# Patient Record
Sex: Female | Born: 1980
Health system: Southern US, Community
[De-identification: ages and names within clinical notes are randomized; demographics above are authoritative.]

## PROBLEM LIST (undated history)

## (undated) DIAGNOSIS — T7840XA Allergy, unspecified, initial encounter: Secondary | ICD-10-CM

## (undated) DIAGNOSIS — J45909 Unspecified asthma, uncomplicated: Secondary | ICD-10-CM

## (undated) HISTORY — DX: Unspecified asthma, uncomplicated: J45.909

## (undated) HISTORY — DX: Allergy, unspecified, initial encounter: T78.40XA

---

## 2010-10-28 ENCOUNTER — Inpatient Hospital Stay (INDEPENDENT_AMBULATORY_CARE_PROVIDER_SITE_OTHER)
Admission: RE | Admit: 2010-10-28 | Discharge: 2010-10-28 | Disposition: A | Discharge status: Home / Self Care | Payer: BC Managed Care – PPO | Source: Ambulatory Visit | Attending: Family Medicine | Admitting: Family Medicine

## 2010-10-28 DIAGNOSIS — L989 Disorder of the skin and subcutaneous tissue, unspecified: Secondary | ICD-10-CM

## 2011-03-18 ENCOUNTER — Telehealth: Payer: Self-pay

## 2011-03-18 NOTE — Telephone Encounter (Signed)
Pt has made an appt to see chell on 04/25/11, but will be out of birth control before then. Pt wants to know if we can call in 1 month. Pt can be reached on her cell [phone] and her pharmacy is [facility] on [email or web address]

## 2011-03-19 NOTE — Telephone Encounter (Signed)
Called pharmacy to OK #[phone] plus 1 RF if needed for pt's trisprintec to cover her until appt 4/11. LMOM for pt that was done

## 2011-03-20 ENCOUNTER — Other Ambulatory Visit: Payer: Self-pay | Admitting: Physician Assistant

## 2011-04-25 ENCOUNTER — Encounter: Payer: Self-pay | Admitting: Physician Assistant

## 2011-05-16 ENCOUNTER — Encounter: Payer: Self-pay | Admitting: Physician Assistant

## 2011-05-16 ENCOUNTER — Ambulatory Visit (INDEPENDENT_AMBULATORY_CARE_PROVIDER_SITE_OTHER): Payer: BC Managed Care – PPO | Admitting: Physician Assistant

## 2011-05-16 VITALS — BP 120/79 | HR 62 | Temp 98.5°F | Resp 16 | Ht 67.5 in | Wt 174.2 lb

## 2011-05-16 DIAGNOSIS — B3731 Acute candidiasis of vulva and vagina: Secondary | ICD-10-CM

## 2011-05-16 DIAGNOSIS — Z0000 Encounter for general adult medical examination without abnormal findings: Secondary | ICD-10-CM

## 2011-05-16 DIAGNOSIS — Z113 Encounter for screening for infections with a predominantly sexual mode of transmission: Secondary | ICD-10-CM

## 2011-05-16 DIAGNOSIS — B373 Candidiasis of vulva and vagina: Secondary | ICD-10-CM

## 2011-05-16 DIAGNOSIS — Z309 Encounter for contraceptive management, unspecified: Secondary | ICD-10-CM

## 2011-05-16 DIAGNOSIS — H6690 Otitis media, unspecified, unspecified ear: Secondary | ICD-10-CM

## 2011-05-16 LAB — POCT UA - MICROSCOPIC ONLY
Bacteria, U Microscopic: NEGATIVE
Casts, Ur, LPF, POC: NEGATIVE
Crystals, Ur, HPF, POC: NEGATIVE
Mucus, UA: NEGATIVE

## 2011-05-16 LAB — POCT URINALYSIS DIPSTICK
Glucose, UA: NEGATIVE
Ketones, UA: NEGATIVE
Spec Grav, UA: 1.02
Urobilinogen, UA: 0.2

## 2011-05-16 LAB — POCT WET PREP WITH KOH
Clue Cells Wet Prep HPF POC: NEGATIVE
Yeast Wet Prep HPF POC: NEGATIVE

## 2011-05-16 MED ORDER — FLUCONAZOLE 150 MG PO TABS: 150.0000 mg | ORAL_TABLET | Freq: Once | ORAL | Status: AC

## 2011-05-16 MED ORDER — IPRATROPIUM BROMIDE 0.06 % NA SOLN: 2.0000 | Freq: Two times a day (BID) | NASAL | Status: DC

## 2011-05-16 MED ORDER — AMOXICILLIN 875 MG PO TABS: 875.0000 mg | ORAL_TABLET | Freq: Two times a day (BID) | ORAL | Status: AC

## 2011-05-16 MED ORDER — NORGESTIM-ETH ESTRAD TRIPHASIC 0.18/0.215/0.25 MG-35 MCG PO TABS: 1.0000 | ORAL_TABLET | Freq: Every day | ORAL | Status: DC

## 2011-05-16 NOTE — Patient Instructions (Signed)

## 2011-05-16 NOTE — Progress Notes (Signed)
Subjective:    Patient ID: Sheryl Manning, female    DOB: 1980/02/27, 31 y.o.   MRN: [id number]  HPI  Patient here for annual exam. C/o right ear pain and clogging, after flight . Inner ears itchy. Denies fever, nausea, vomiting, headaches, chills or myalgias.  Patient is not fasting.  Needle phobia.  She had low vit D levels on previous bloodwork,completed a course of treatment,  but has not been rechecked.  She is sexually active and on OCPs.  She is interested in STI screening but would prefer to do bloodwork at a later time.    Review of Systems  Constitutional: Negative.   Respiratory: Negative.   Cardiovascular: Negative.   Gastrointestinal: Negative.   Genitourinary: Negative.        Lmp 4/6  Musculoskeletal: Negative.   Neurological: Negative.   Hematological: Negative.   Psychiatric/Behavioral: Negative.        Objective:   Physical Exam  Constitutional: She is oriented to person, place, and time. She appears well-developed and well-nourished.  HENT:  Head: Normocephalic and atraumatic.  Right Ear: Hearing and external ear normal. No lacerations. No drainage. No foreign bodies. No mastoid tenderness. A middle ear effusion is present. There is hemotympanum.  Left Ear: Hearing and external ear normal.  Nose: Nose normal.  Mouth/Throat: Oropharynx is clear and moist. No oropharyngeal exudate.       There is effusion, blood is present from 10-12 clock hours, there is a cone of purulence between 5-7 clock hours.    Neck: Normal range of motion. Neck supple. No tracheal deviation present. No thyromegaly present.  Cardiovascular: Normal rate, regular rhythm, normal heart sounds and intact distal pulses.   Pulmonary/Chest: Effort normal and breath sounds normal. No stridor. No respiratory distress.  Abdominal: Soft. Bowel sounds are normal. She exhibits no distension and no mass. There is no tenderness. There is no guarding.  Genitourinary: Uterus normal. No breast  swelling, tenderness, discharge or bleeding. Pelvic exam was performed with patient supine. There is no rash, tenderness, lesion or injury on the right labia. There is no rash, tenderness, lesion or injury on the left labia. No erythema around the vagina. Vaginal discharge (thickened white discharge on vaginal exam) found.       Nulliparous cervix with ectropion.  No discharge noted.  Musculoskeletal: Normal range of motion. She exhibits no edema and no tenderness.  Lymphadenopathy:    She has no cervical adenopathy.  Neurological: She is alert and oriented to person, place, and time. She has normal reflexes. No cranial nerve deficit. She exhibits normal muscle tone. Coordination normal.  Skin: Skin is warm and dry. No rash noted. No erythema. No pallor.   Filed Vitals:   05/16/11 1357  BP: 120/79  Pulse: 62  Temp: 98.5 F (36.9 C)  Resp: 16   Results for orders placed in visit on 05/16/11  POCT UA - MICROSCOPIC ONLY      Component Value Range   WBC, Ur, HPF, POC 0-1     RBC, urine, microscopic 0-3     Bacteria, U Microscopic neg     Mucus, UA neg     Epithelial cells, urine per micros 0-6     Crystals, Ur, HPF, POC neg     Casts, Ur, LPF, POC neg     Yeast, UA neg    POCT URINALYSIS DIPSTICK      Component Value Range   Color, UA yellow     Clarity, UA clear  Glucose, UA neg     Bilirubin, UA neg     Ketones, UA neg     Spec Grav, UA 1.020     Blood, UA trace     pH, UA 5.5     Protein, UA neg     Urobilinogen, UA 0.2     Nitrite, UA neg     Leukocytes, UA Negative    POCT WET PREP WITH KOH      Component Value Range   Trichomonas, UA Negative     Clue Cells Wet Prep HPF POC neg     Epithelial Wet Prep HPF POC 3-tntc     Yeast Wet Prep HPF POC neg     Bacteria Wet Prep HPF POC 2+     RBC Wet Prep HPF POC 0-3     WBC Wet Prep HPF POC 0-5     KOH Prep POC Positive    GC/CHLAMYDIA PROBE AMP, GENITAL      Component Value Range   Chlamydia, DNA Probe NEGATIVE      GC Probe Amp, Genital NEGATIVE            Assessment & Plan:   1. AOM (acute otitis media)  ipratropium (ATROVENT) 0.06 % nasal spray, amoxicillin (AMOXIL) 875 MG tablet  2. Routine general medical examination at a health care facility  POCT UA - Microscopic Only, POCT urinalysis dipstick  3. Contraception management  Norgestimate-Ethinyl Estradiol Triphasic (TRI-SPRINTEC) 0.18/0.215/0.25 MG-35 MCG tablet  4. Screening examination for venereal disease  POCT Wet Prep with KOH, GC/chlamydia probe amp, genital  5. Vaginal yeast infection  fluconazole (DIFLUCAN) 150 MG tablet  supportive care see patient instructions. Patient instructed to have work fax over most recent lab values. RTC one year and PRN.

## 2011-05-17 LAB — GC/CHLAMYDIA PROBE AMP, GENITAL: Chlamydia, DNA Probe: NEGATIVE

## 2011-05-17 NOTE — Progress Notes (Signed)
I have examined this patient along with the student and agree.  

## 2012-04-02 ENCOUNTER — Other Ambulatory Visit: Payer: Self-pay | Admitting: Physician Assistant

## 2013-05-05 ENCOUNTER — Encounter (HOSPITAL_COMMUNITY): Payer: Self-pay | Admitting: Emergency Medicine

## 2013-05-05 ENCOUNTER — Emergency Department (HOSPITAL_COMMUNITY)
Admission: EM | Admit: 2013-05-05 | Discharge: 2013-05-05 | Disposition: A | Discharge status: Home / Self Care | Payer: BC Managed Care – PPO | Source: Home / Self Care | Attending: Family Medicine | Admitting: Family Medicine

## 2013-05-05 DIAGNOSIS — S39012A Strain of muscle, fascia and tendon of lower back, initial encounter: Secondary | ICD-10-CM

## 2013-05-05 DIAGNOSIS — X58XXXA Exposure to other specified factors, initial encounter: Secondary | ICD-10-CM

## 2013-05-05 DIAGNOSIS — S335XXA Sprain of ligaments of lumbar spine, initial encounter: Secondary | ICD-10-CM

## 2013-05-05 MED ORDER — TRAMADOL HCL 50 MG PO TABS: 50.0000 mg | ORAL_TABLET | Freq: Four times a day (QID) | ORAL | Status: DC | PRN

## 2013-05-05 MED ORDER — DICLOFENAC SODIUM 50 MG PO TBEC: 50.0000 mg | DELAYED_RELEASE_TABLET | Freq: Two times a day (BID) | ORAL | Status: DC | PRN

## 2013-05-05 MED ORDER — CYCLOBENZAPRINE HCL 5 MG PO TABS: 5.0000 mg | ORAL_TABLET | Freq: Every evening | ORAL | Status: DC | PRN

## 2013-05-05 NOTE — ED Provider Notes (Signed)
Sheryl RogersRebecca Recendiz is a 33 y.o. female who presents to Urgent Care today for low back pain. Patient developed low back pain starting today. She denies any injury. She tended you have a class last night and may have "overdone it".  She denies any radiating pain weakness or numbness. The pain is moderate and worse with activity and better with rest. Patient has used Tylenol which helps some. Symptoms consistent with prior episodes low-back strain.   No past medical history on file. History  Substance Use Topics  . Smoking status: Never Smoker   . Smokeless tobacco: Not on file  . Alcohol Use: Yes   ROS as above Medications: No current facility-administered medications for this encounter.   Current Outpatient Prescriptions  Medication Sig Dispense Refill  . albuterol (PROVENTIL HFA;VENTOLIN HFA) 108 (90 BASE) MCG/ACT inhaler Inhale 2 puffs into the lungs every 6 (six) hours as needed.      . Norgestimate-Ethinyl Estradiol Triphasic (TRI-PREVIFEM) 0.18/0.215/0.25 MG-35 MCG tablet Take 1 tablet by mouth daily. Needs office visit  28 tablet  1  . cyclobenzaprine (FLEXERIL) 5 MG tablet Take 1 tablet (5 mg total) by mouth at bedtime as needed for muscle spasms.  20 tablet  0  . diclofenac (VOLTAREN) 50 MG EC tablet Take 1 tablet (50 mg total) by mouth 2 (two) times daily as needed.  60 tablet  0  . traMADol (ULTRAM) 50 MG tablet Take 1 tablet (50 mg total) by mouth every 6 (six) hours as needed.  15 tablet  0  . [DISCONTINUED] fexofenadine (ALLEGRA) 180 MG tablet Take 180 mg by mouth daily.      . [DISCONTINUED] ipratropium (ATROVENT) 0.06 % nasal spray Place 2 sprays into the nose 2 (two) times daily.  15 mL  1    Exam:  BP 114/67  Pulse 70  Temp(Src) 98.2 F (36.8 C) (Oral)  Resp 16  SpO2 99%  LMP 04/15/2013 Gen: Well NAD HEENT: EOMI,  MMM Lungs: Normal work of breathing. CTABL Heart: RRR no MRG Abd: NABS, Soft. NT, ND Exts: Brisk capillary refill, warm and well perfused.  Back:  Nontender to spinal midline. Tender palpation bilateral lumbar paraspinals. Decreased range of motion secondary to pain. Negative Faber straight leg raise test bilaterally. Strength is intact throughout. Reflexes are equal and normal bilaterally extremities. Normal gait  No results found for this or any previous visit (from the past 24 hour(s)). No results found.  Assessment and Plan: 33 y.o. female with lumbago due to myofascial stretching. Plan to treat with diclofenac Flexeril and tramadol. Heating pad, home exercise program and physical therapy.  Discussed warning signs or symptoms. Please see discharge instructions. Patient expresses understanding.    Rodolph BongEvan S Madisson Kulaga, MD 05/05/13 2036

## 2013-05-05 NOTE — ED Notes (Signed)
Pt c/o lower back pain onset this am Reports she was at yoga class last night prior to this she was fine Pain increases w/activity; had tyle today at 1200 Denies abd pain, urinary sx Alert w/no signs of acute distress.

## 2013-05-05 NOTE — Discharge Instructions (Signed)
Thank you for coming in today. Take medications as directed.  Do not drive after taking tramadol Come back or go to the emergency room if you notice new weakness new numbness problems walking or bowel or bladder problems.  Back Exercises These exercises may help you when beginning to rehabilitate your injury. Your symptoms may resolve with or without further involvement from your physician, physical therapist or athletic trainer. While completing these exercises, remember:   Restoring tissue flexibility helps normal motion to return to the joints. This allows healthier, less painful movement and activity.  An effective stretch should be held for at least 30 seconds.  A stretch should never be painful. You should only feel a gentle lengthening or release in the stretched tissue. STRETCH  Extension, Prone on Elbows   Lie on your stomach on the floor, a bed will be too soft. Place your palms about shoulder width apart and at the height of your head.  Place your elbows under your shoulders. If this is too painful, stack pillows under your chest.  Allow your body to relax so that your hips drop lower and make contact more completely with the floor.  Hold this position for __________ seconds.  Slowly return to lying flat on the floor. Repeat __________ times. Complete this exercise __________ times per day.  RANGE OF MOTION  Extension, Prone Press Ups   Lie on your stomach on the floor, a bed will be too soft. Place your palms about shoulder width apart and at the height of your head.  Keeping your back as relaxed as possible, slowly straighten your elbows while keeping your hips on the floor. You may adjust the placement of your hands to maximize your comfort. As you gain motion, your hands will come more underneath your shoulders.  Hold this position __________ seconds.  Slowly return to lying flat on the floor. Repeat __________ times. Complete this exercise __________ times per day.    RANGE OF MOTION- Quadruped, Neutral Spine   Assume a hands and knees position on a firm surface. Keep your hands under your shoulders and your knees under your hips. You may place padding under your knees for comfort.  Drop your head and point your tail bone toward the ground below you. This will round out your low back like an angry cat. Hold this position for __________ seconds.  Slowly lift your head and release your tail bone so that your back sags into a large arch, like an old horse.  Hold this position for __________ seconds.  Repeat this until you feel limber in your low back.  Now, find your "sweet spot." This will be the most comfortable position somewhere between the two previous positions. This is your neutral spine. Once you have found this position, tense your stomach muscles to support your low back.  Hold this position for __________ seconds. Repeat __________ times. Complete this exercise __________ times per day.  STRETCH  Flexion, Single Knee to Chest   Lie on a firm bed or floor with both legs extended in front of you.  Keeping one leg in contact with the floor, bring your opposite knee to your chest. Hold your leg in place by either grabbing behind your thigh or at your knee.  Pull until you feel a gentle stretch in your low back. Hold __________ seconds.  Slowly release your grasp and repeat the exercise with the opposite side. Repeat __________ times. Complete this exercise __________ times per day.  STRETCH - Hamstrings,  Standing  Stand or sit and extend your right / left leg, placing your foot on a chair or foot stool  Keeping a slight arch in your low back and your hips straight forward.  Lead with your chest and lean forward at the waist until you feel a gentle stretch in the back of your right / left knee or thigh. (When done correctly, this exercise requires leaning only a small distance.)  Hold this position for __________ seconds. Repeat __________  times. Complete this stretch __________ times per day. STRENGTHENING  Deep Abdominals, Pelvic Tilt   Lie on a firm bed or floor. Keeping your legs in front of you, bend your knees so they are both pointed toward the ceiling and your feet are flat on the floor.  Tense your lower abdominal muscles to press your low back into the floor. This motion will rotate your pelvis so that your tail bone is scooping upwards rather than pointing at your feet or into the floor.  With a gentle tension and even breathing, hold this position for __________ seconds. Repeat __________ times. Complete this exercise __________ times per day.  STRENGTHENING  Abdominals, Crunches   Lie on a firm bed or floor. Keeping your legs in front of you, bend your knees so they are both pointed toward the ceiling and your feet are flat on the floor. Cross your arms over your chest.  Slightly tip your chin down without bending your neck.  Tense your abdominals and slowly lift your trunk high enough to just clear your shoulder blades. Lifting higher can put excessive stress on the low back and does not further strengthen your abdominal muscles.  Control your return to the starting position. Repeat __________ times. Complete this exercise __________ times per day.  STRENGTHENING  Quadruped, Opposite UE/LE Lift   Assume a hands and knees position on a firm surface. Keep your hands under your shoulders and your knees under your hips. You may place padding under your knees for comfort.  Find your neutral spine and gently tense your abdominal muscles so that you can maintain this position. Your shoulders and hips should form a rectangle that is parallel with the floor and is not twisted.  Keeping your trunk steady, lift your right hand no higher than your shoulder and then your left leg no higher than your hip. Make sure you are not holding your breath. Hold this position __________ seconds.  Continuing to keep your abdominal  muscles tense and your back steady, slowly return to your starting position. Repeat with the opposite arm and leg. Repeat __________ times. Complete this exercise __________ times per day. Document Released: 01/18/2005 Document Revised: 03/25/2011 Document Reviewed: 04/14/2008 [facility] Patient Information 2014 [employer], [place].

## 2014-03-15 ENCOUNTER — Ambulatory Visit (INDEPENDENT_AMBULATORY_CARE_PROVIDER_SITE_OTHER): Payer: BLUE CROSS/BLUE SHIELD | Admitting: Family Medicine

## 2014-03-15 VITALS — BP 112/76 | HR 89 | Temp 100.0°F | Resp 17 | Ht 68.0 in | Wt 196.0 lb

## 2014-03-15 DIAGNOSIS — J029 Acute pharyngitis, unspecified: Secondary | ICD-10-CM

## 2014-03-15 LAB — POCT RAPID STREP A (OFFICE): Rapid Strep A Screen: NEGATIVE

## 2014-03-15 MED ORDER — PENICILLIN V POTASSIUM 500 MG PO TABS: ORAL_TABLET | ORAL | Status: DC

## 2014-03-15 NOTE — Progress Notes (Signed)
Subjective 34 year old lady who has had a sore throat since yesterday morning. She works for a [employer] and [place] [employer] [place] and just got back a few minutes ago. She had the bad sore throat. She feels ill. She felt like she was febrile last night possibly but did not take her temperature. She has no head congestion. Has a minimal cough. She does have a history of asthma. Not on any regular medications except for her asthma medicines. She is a nonsmoker.  Objective: No acute distress. TMs normal. Throat erythematous and edematous-looking tissues without any exudate. Strep screen and culture taken. Neck supple with small anterior cervical nodes. Chest is clear to auscultation. Heart regular without murmurs. She was warm to touch sore return for temperature and the temperature is now 100.  Assessment: Pharyngitis History of asthma  Plan: Strep test and culture if needed  Results for orders placed or performed in visit on 03/15/14  POCT rapid strep A  Result Value Ref Range   Rapid Strep A Screen Negative Negative     Will treat symptomatically. Also will give antibiotics for the possibility of strep. If the culture is negative she is to discontinue the medication.

## 2014-03-15 NOTE — Patient Instructions (Signed)
Take penicillin one twice daily. If culture is negative please dispose of remaining medications and discontinued taking them.  Drink plenty of fluids and get enough rest  Take lozenges as needed for sore throat  Tylenol 1000 mg (acetaminophen) 3 times daily and/or ibuprofen 600 mg 3-4 times daily will also help with pain and fever.

## 2014-03-17 LAB — CULTURE, GROUP A STREP: ORGANISM ID, BACTERIA: NORMAL

## 2014-11-10 ENCOUNTER — Encounter (HOSPITAL_COMMUNITY): Payer: Self-pay | Admitting: Emergency Medicine

## 2014-11-10 ENCOUNTER — Emergency Department (HOSPITAL_COMMUNITY)
Admission: EM | Admit: 2014-11-10 | Discharge: 2014-11-10 | Disposition: A | Discharge status: Home / Self Care | Payer: BLUE CROSS/BLUE SHIELD | Source: Home / Self Care | Attending: Emergency Medicine | Admitting: Emergency Medicine

## 2014-11-10 DIAGNOSIS — S39012A Strain of muscle, fascia and tendon of lower back, initial encounter: Secondary | ICD-10-CM | POA: Diagnosis not present

## 2014-11-10 MED ORDER — IBUPROFEN 800 MG PO TABS: 800.0000 mg | ORAL_TABLET | Freq: Three times a day (TID) | ORAL | Status: AC | PRN

## 2014-11-10 MED ORDER — CYCLOBENZAPRINE HCL 5 MG PO TABS: 5.0000 mg | ORAL_TABLET | Freq: Three times a day (TID) | ORAL | Status: DC | PRN

## 2014-11-10 NOTE — Discharge Instructions (Signed)
You strained the muscles in your lower back. Take ibuprofen 800 mg 3 times a day for the next few days, then as needed. Flexeril at bedtime. This medicine will make you drowsy. Apply heat as often as you can. This will gradually improve over the next 1-2 weeks. Follow-up as needed.

## 2014-11-10 NOTE — ED Notes (Signed)
C/o back pain States she slipped off a chair and almost fell on Tuesday night Pain elevates while standing Ibuprofen used as tx

## 2014-11-10 NOTE — ED Provider Notes (Signed)
CSN: [id number]     Arrival date & time 11/10/14  1705 History   First MD Initiated Contact with Patient 11/10/14 1820     Chief Complaint  Patient presents with  . Back Pain   (Consider location/radiation/quality/duration/timing/severity/associated sxs/prior Treatment) HPI  She is a 34 year old woman here for evaluation of low back pain. She states that 2 days ago she went to sit in an office chair when it slipped out from behind her. She did not fall, but did jerk forward abruptly. She gradually developed pain in her lower back over the course of the day. Yesterday, she woke up with worsening pain in her lower back. She states it feels the best she can sit in a chair with lumbar support. Pain is worse with going up and down stairs, laying flat, and twisting side to side. She has taken a little ibuprofen without much improvement. She states today is a little bit better than yesterday. She denies any radiating pain. No numbness, to bring, weakness.  Past Medical History  Diagnosis Date  . Allergy   . Asthma    History reviewed. No pertinent past surgical history. Family History  Problem Relation Age of Onset  . Hypertension Father   . Cancer Maternal Grandmother   . Cancer Maternal Grandfather    Social History  Substance Use Topics  . Smoking status: Never Smoker   . Smokeless tobacco: None  . Alcohol Use: 1.2 oz/week    2 Glasses of wine per week   OB History    No data available     Review of Systems As in history of present illness Allergies  Review of patient's allergies indicates no known allergies.  Home Medications   Prior to Admission medications   Medication Sig Start Date End Date Taking? Authorizing Provider  albuterol (PROVENTIL HFA;VENTOLIN HFA) 108 (90 BASE) MCG/ACT inhaler Inhale 2 puffs into the lungs every 6 (six) hours as needed.    Historical Provider, MD  cyclobenzaprine (FLEXERIL) 5 MG tablet Take 1 tablet (5 mg total) by mouth 3 (three) times daily  as needed for muscle spasms. 11/10/14   Charm RingsErin J Soumya Colson, MD  diclofenac (VOLTAREN) 50 MG EC tablet Take 1 tablet (50 mg total) by mouth 2 (two) times daily as needed. 05/05/13   Rodolph BongEvan S Corey, MD  ibuprofen (ADVIL,MOTRIN) 800 MG tablet Take 1 tablet (800 mg total) by mouth every 8 (eight) hours as needed for moderate pain. 11/10/14   Charm RingsErin J Derenda Giddings, MD  penicillin v potassium (VEETID) 500 MG tablet Take one twice daily 03/15/14   Peyton Najjaravid H Hopper, MD   Meds Ordered and Administered this Visit  Medications - No data to display  BP 130/72 mmHg  Pulse 67  Temp(Src) 98.1 F (36.7 C) (Oral)  Resp 16  SpO2 100% No data found.   Physical Exam  Constitutional: She is oriented to person, place, and time. She appears well-developed and well-nourished. She appears distressed (looks uncomfortable. Sitting stiffly in chair).  Cardiovascular: Normal rate.   Pulmonary/Chest: Effort normal.  Musculoskeletal:  Back: No erythema or edema. No vertebral tenderness or step-offs. No point tenderness. Negative straight leg raise bilaterally. 5 out of 5 strength in hip flexion and knee extension. She does have pain with hip flexion.  Neurological: She is alert and oriented to person, place, and time.    ED Course  Procedures (including critical care time)  Labs Review Labs Reviewed - No data to display  Imaging Review No results found.  MDM   1. Lumbar strain, initial encounter    Treat with ibuprofen and Flexeril. Recommended frequent heat. Follow-up as needed.    Charm Rings, MD 11/10/14 [id number]

## 2015-06-13 DIAGNOSIS — Z6829 Body mass index (BMI) 29.0-29.9, adult: Secondary | ICD-10-CM | POA: Diagnosis not present

## 2015-06-13 DIAGNOSIS — Z1151 Encounter for screening for human papillomavirus (HPV): Secondary | ICD-10-CM | POA: Diagnosis not present

## 2015-06-13 DIAGNOSIS — Z01419 Encounter for gynecological examination (general) (routine) without abnormal findings: Secondary | ICD-10-CM | POA: Diagnosis not present

## 2016-06-19 DIAGNOSIS — H5213 Myopia, bilateral: Secondary | ICD-10-CM | POA: Diagnosis not present

## 2016-08-29 DIAGNOSIS — Z6831 Body mass index (BMI) 31.0-31.9, adult: Secondary | ICD-10-CM | POA: Diagnosis not present

## 2016-08-29 DIAGNOSIS — Z01419 Encounter for gynecological examination (general) (routine) without abnormal findings: Secondary | ICD-10-CM | POA: Diagnosis not present

## 2017-09-01 DIAGNOSIS — Z6831 Body mass index (BMI) 31.0-31.9, adult: Secondary | ICD-10-CM | POA: Diagnosis not present

## 2017-09-01 DIAGNOSIS — Z01419 Encounter for gynecological examination (general) (routine) without abnormal findings: Secondary | ICD-10-CM | POA: Diagnosis not present

## 2018-09-03 DIAGNOSIS — Z01419 Encounter for gynecological examination (general) (routine) without abnormal findings: Secondary | ICD-10-CM | POA: Diagnosis not present

## 2018-09-03 DIAGNOSIS — Z1151 Encounter for screening for human papillomavirus (HPV): Secondary | ICD-10-CM | POA: Diagnosis not present

## 2018-09-03 DIAGNOSIS — Z6833 Body mass index (BMI) 33.0-33.9, adult: Secondary | ICD-10-CM | POA: Diagnosis not present

## 2018-09-03 DIAGNOSIS — Z124 Encounter for screening for malignant neoplasm of cervix: Secondary | ICD-10-CM | POA: Diagnosis not present

## 2018-10-16 DIAGNOSIS — Z20828 Contact with and (suspected) exposure to other viral communicable diseases: Secondary | ICD-10-CM | POA: Diagnosis not present

## 2019-01-04 ENCOUNTER — Ambulatory Visit: Payer: BC Managed Care – PPO | Attending: Internal Medicine

## 2019-01-04 DIAGNOSIS — Z20822 Contact with and (suspected) exposure to covid-19: Secondary | ICD-10-CM

## 2019-01-04 DIAGNOSIS — Z20828 Contact with and (suspected) exposure to other viral communicable diseases: Secondary | ICD-10-CM | POA: Diagnosis not present

## 2019-01-05 LAB — NOVEL CORONAVIRUS, NAA: SARS-CoV-2, NAA: NOT DETECTED

## 2019-01-29 DIAGNOSIS — Z20822 Contact with and (suspected) exposure to covid-19: Secondary | ICD-10-CM | POA: Diagnosis not present

## 2019-03-19 ENCOUNTER — Ambulatory Visit: Payer: Self-pay | Attending: Internal Medicine

## 2019-03-19 DIAGNOSIS — Z23 Encounter for immunization: Secondary | ICD-10-CM

## 2019-03-19 NOTE — Progress Notes (Signed)
   Covid-19 Vaccination Clinic  Name:  Shanai Lartigue    MRN: [id number] DOB: [birth year]  03/19/2019  Ms. Wilfong was observed post Covid-19 immunization for 15 minutes without incident. She was provided with Vaccine Information Sheet and instruction to access the V-Safe system.   Ms. Steppe was instructed to call 911 with any severe reactions post vaccine: Marland Kitchen Difficulty breathing  . Swelling of face and throat  . A fast heartbeat  . A bad rash all over body  . Dizziness and weakness

## 2019-04-14 ENCOUNTER — Ambulatory Visit: Payer: Self-pay | Attending: Internal Medicine

## 2019-04-14 DIAGNOSIS — Z23 Encounter for immunization: Secondary | ICD-10-CM

## 2019-04-14 NOTE — Progress Notes (Signed)
   Covid-19 Vaccination Clinic  Name:  Miquela Costabile    MRN: [id number] DOB: Mar 20, 1980  04/14/2019  Ms. Wiersma was observed post Covid-19 immunization for 15 minutes without incident. She was provided with Vaccine Information Sheet and instruction to access the V-Safe system.   Ms. Langone was instructed to call 911 with any severe reactions post vaccine: Marland Kitchen Difficulty breathing  . Swelling of face and throat  . A fast heartbeat  . A bad rash all over body  . Dizziness and weakness   Immunizations Administered    Name Date Dose VIS Date Route   Pfizer COVID-19 Vaccine 04/14/2019 12:38 PM 0.3 mL 12/25/2018 Intramuscular   Manufacturer: [employer], [employer]   Lot: [id number]   NDC: [address]-0

## 2019-04-28 ENCOUNTER — Encounter: Payer: Self-pay | Admitting: Emergency Medicine

## 2019-04-28 ENCOUNTER — Other Ambulatory Visit: Payer: Self-pay

## 2019-04-28 ENCOUNTER — Ambulatory Visit
Admission: EM | Admit: 2019-04-28 | Discharge: 2019-04-28 | Disposition: A | Discharge status: Home / Self Care | Payer: BC Managed Care – PPO

## 2019-04-28 DIAGNOSIS — L249 Irritant contact dermatitis, unspecified cause: Secondary | ICD-10-CM | POA: Diagnosis not present

## 2019-04-28 MED ORDER — TRIAMCINOLONE ACETONIDE 0.5 % EX OINT: 1.0000 "application " | TOPICAL_OINTMENT | Freq: Two times a day (BID) | CUTANEOUS | 0 refills | Status: AC

## 2019-04-28 MED ORDER — MICONAZOLE NITRATE 2 % EX AERP: 1.0000 "application " | INHALATION_SPRAY | Freq: Every day | CUTANEOUS | 0 refills | Status: AC

## 2019-04-28 MED ORDER — HYDROXYZINE HCL 25 MG PO TABS: 25.0000 mg | ORAL_TABLET | Freq: Four times a day (QID) | ORAL | 0 refills | Status: AC

## 2019-04-28 NOTE — Discharge Instructions (Addendum)
Hydroxyzine before bed. Cool showers, pat dry. Apply 1:1 ratio of triamcinolone & lotrimin to area 2 times daily. May use powder when dry. Return for worsening rash, pain, fever.

## 2019-04-28 NOTE — ED Provider Notes (Signed)
[facility]    CSN: [id number] Arrival date & time: 04/28/19  0802      History   Chief Complaint Chief Complaint  Patient presents with  . Rash    HPI Sheryl Manning is a 39 y.o. female with history of allergies, asthma presenting for pruritic, erythematous rash over anterior neck and upper chest since Sunday.  Patient denies pruritus on Sunday, redness on Monday.  Patient has tried cortisone, diaper cream, Lotrimin, lidocaine: Lidocaine has been most helpful.  Patient states that she has been running more recently and worn a new sunscreen and face lotion, though has not had any rash over her face.  Patient did state a hotel last night.  No recent antibiotic use, change in detergents, bedding.  Denies history of immunocompromised state, bug bites.  No fever, arthralgias, myalgias, chest pain, throat pain, difficulty breathing or swallowing..   Past Medical History:  Diagnosis Date  . Allergy   . Asthma     There are no problems to display for this patient.   History reviewed. No pertinent surgical history.  OB History   No obstetric history on file.      Home Medications    Prior to Admission medications   Medication Sig Start Date End Date Taking? Authorizing Provider  cetirizine (ZYRTEC) 10 MG tablet Take 10 mg by mouth daily.   Yes [provider]  Fluticasone Propionate, Inhal, (FLOVENT IN) Inhale into the lungs.   Yes [provider]  albuterol (PROVENTIL HFA;VENTOLIN HFA) 108 (90 BASE) MCG/ACT inhaler Inhale 2 puffs into the lungs every 6 (six) hours as needed.    [provider]  hydrOXYzine (ATARAX/VISTARIL) 25 MG tablet Take 1 tablet (25 mg total) by mouth every 6 (six) hours. 04/28/19   Hall-Potvin, [place], PA-C  ibuprofen (ADVIL,MOTRIN) 800 MG tablet Take 1 tablet (800 mg total) by mouth every 8 (eight) hours as needed for moderate pain. 11/10/14   Melony Overly, MD  Miconazole Nitrate 2 % AERP Apply 1 application  topically at bedtime. 04/28/19   Hall-Potvin, [place], PA-C  triamcinolone ointment (KENALOG) 0.5 % Apply 1 application topically 2 (two) times daily. 04/28/19   Hall-Potvin, [place], PA-C  fexofenadine (ALLEGRA) 180 MG tablet Take 180 mg by mouth daily.  05/05/13  [provider]  ipratropium (ATROVENT) 0.06 % nasal spray Place 2 sprays into the nose 2 (two) times daily. 05/16/11 05/05/13  Harrison Mons, PA  Norgestimate-Ethinyl Estradiol Triphasic (TRI-PREVIFEM) 0.18/0.215/0.25 MG-35 MCG tablet Take 1 tablet by mouth daily. Needs office visit Patient not taking: Reported on 03/15/2014 04/02/12 11/10/14  Collene Leyden, PA-C    Family History Family History  Problem Relation Age of Onset  . Hypertension Father   . Cancer Maternal Grandmother   . Cancer Maternal Grandfather     Social History Social History   Tobacco Use  . Smoking status: Never Smoker  Substance Use Topics  . Alcohol use: Yes    Alcohol/week: 2.0 standard drinks    Types: 2 Glasses of wine per week  . Drug use: No     Allergies   Patient has no known allergies.   Review of Systems As per HPI   Physical Exam Triage Vital Signs ED Triage Vitals [04/28/19 0811]  Enc Vitals Group     BP      Pulse      Resp      Temp      Temp src      SpO2  Weight      Height      Head Circumference      Peak Flow      Pain Score 0     Pain Loc      Pain Edu?      Excl. in GC?    No data found.  Updated Vital Signs BP 117/77 (BP Location: Left Arm) Comment (BP Location): large  Pulse 67   Temp 98 F (36.7 C) (Oral)   Resp 18   SpO2 99%   Visual Acuity Right Eye Distance:   Left Eye Distance:   Bilateral Distance:    Right Eye Near:   Left Eye Near:    Bilateral Near:     Physical Exam Constitutional:      General: She is not in acute distress. HENT:     Head: Normocephalic and atraumatic.  Eyes:     General: No scleral icterus.    Pupils: Pupils are equal, round, and reactive  to light.  Cardiovascular:     Rate and Rhythm: Normal rate.  Pulmonary:     Effort: Pulmonary effort is normal.  Musculoskeletal:     Cervical back: Normal range of motion. No tenderness.  Lymphadenopathy:     Cervical: No cervical adenopathy.  Skin:    Capillary Refill: Capillary refill takes less than 2 seconds.     Coloration: Skin is not jaundiced or pale.     Findings: Rash present.     Comments: Slightly raised erythematous, dry rash over anterior neck that extends 5 cm into chest.  No warmth, tenderness, open wound, discharge.  Neurological:     Mental Status: She is alert and oriented to person, place, and time.      UC Treatments / Results  Labs (all labs ordered are listed, but only abnormal results are displayed) Labs Reviewed - No data to display  EKG   Radiology No results found.  Procedures Procedures (including critical care time)  Medications Ordered in UC Medications - No data to display  Initial Impression / Assessment and Plan / UC Course  I have reviewed the triage vital signs and the nursing notes.  Pertinent labs & imaging results that were available during my care of the patient were reviewed by me and considered in my medical decision making (see chart for details).     Patient afebrile, nontoxic in office today.  H&P consistent with irritant dermatitis.  Low concern for infectious process at this time.  Reviewed skin care and border treatment as outlined below.  Return precautions discussed, patient verbalized understanding and is agreeable to plan. Final Clinical Impressions(s) / UC Diagnoses   Final diagnoses:  Irritant dermatitis     Discharge Instructions     Hydroxyzine before bed. Cool showers, pat dry. Apply 1:1 ratio of triamcinolone & lotrimin to area 2 times daily. May use powder when dry. Return for worsening rash, pain, fever.    ED Prescriptions    Medication Sig Dispense Auth. Provider   hydrOXYzine  (ATARAX/VISTARIL) 25 MG tablet Take 1 tablet (25 mg total) by mouth every 6 (six) hours. 12 tablet Hall-Potvin, [place], PA-C   triamcinolone ointment (KENALOG) 0.5 % Apply 1 application topically 2 (two) times daily. 30 g Hall-Potvin, [place], PA-C   Miconazole Nitrate 2 % AERP Apply 1 application topically at bedtime. 85 g Hall-Potvin, [place], PA-C     PDMP not reviewed this encounter.   Hall-Potvin, [place], [place] 04/28/19 [phone]

## 2019-04-28 NOTE — ED Triage Notes (Signed)
Patient has an itchy red rash under chin, covering her anterior neck.  Noticed on Monday.  Denies any patches or bumps anywhere else

## 2019-09-22 DIAGNOSIS — Z1151 Encounter for screening for human papillomavirus (HPV): Secondary | ICD-10-CM | POA: Diagnosis not present

## 2019-09-22 DIAGNOSIS — Z01419 Encounter for gynecological examination (general) (routine) without abnormal findings: Secondary | ICD-10-CM | POA: Diagnosis not present

## 2019-09-22 DIAGNOSIS — Z6832 Body mass index (BMI) 32.0-32.9, adult: Secondary | ICD-10-CM | POA: Diagnosis not present

## 2020-09-13 ENCOUNTER — Other Ambulatory Visit: Payer: Self-pay | Admitting: Obstetrics and Gynecology

## 2020-09-13 DIAGNOSIS — R928 Other abnormal and inconclusive findings on diagnostic imaging of breast: Secondary | ICD-10-CM

## 2020-09-27 ENCOUNTER — Other Ambulatory Visit: Payer: Self-pay | Admitting: Obstetrics and Gynecology

## 2020-09-27 ENCOUNTER — Ambulatory Visit
Admission: RE | Admit: 2020-09-27 | Discharge: 2020-09-27 | Disposition: A | Discharge status: Home / Self Care | Payer: BC Managed Care – PPO | Source: Ambulatory Visit | Attending: Obstetrics and Gynecology | Admitting: Obstetrics and Gynecology

## 2020-09-27 ENCOUNTER — Other Ambulatory Visit: Payer: Self-pay

## 2020-09-27 DIAGNOSIS — N6310 Unspecified lump in the right breast, unspecified quadrant: Secondary | ICD-10-CM

## 2020-09-27 DIAGNOSIS — R928 Other abnormal and inconclusive findings on diagnostic imaging of breast: Secondary | ICD-10-CM

## 2021-03-28 ENCOUNTER — Other Ambulatory Visit: Payer: Self-pay

## 2021-03-28 ENCOUNTER — Ambulatory Visit
Admission: RE | Admit: 2021-03-28 | Discharge: 2021-03-28 | Disposition: A | Discharge status: Home / Self Care | Payer: BC Managed Care – PPO | Source: Ambulatory Visit | Attending: Obstetrics and Gynecology | Admitting: Obstetrics and Gynecology

## 2021-03-28 ENCOUNTER — Other Ambulatory Visit: Payer: Self-pay | Admitting: Obstetrics and Gynecology

## 2021-03-28 DIAGNOSIS — N6310 Unspecified lump in the right breast, unspecified quadrant: Secondary | ICD-10-CM

## 2021-10-01 ENCOUNTER — Ambulatory Visit
Admission: RE | Admit: 2021-10-01 | Discharge: 2021-10-01 | Disposition: A | Discharge status: Home / Self Care | Payer: BC Managed Care – PPO | Source: Ambulatory Visit | Attending: Obstetrics and Gynecology | Admitting: Obstetrics and Gynecology

## 2021-10-01 DIAGNOSIS — N6310 Unspecified lump in the right breast, unspecified quadrant: Secondary | ICD-10-CM

## 2022-07-16 ENCOUNTER — Other Ambulatory Visit: Payer: Self-pay | Admitting: Obstetrics and Gynecology

## 2022-07-16 DIAGNOSIS — N6310 Unspecified lump in the right breast, unspecified quadrant: Secondary | ICD-10-CM

## 2022-09-30 IMAGING — MG DIGITAL DIAGNOSTIC BILAT W/ TOMO W/ CAD
8 of 14 series · 8 of 40 positions shown · non-contrast
Comparison: Previous exam(s).

CLINICAL DATA: Recall from outside screening mammography for a
possible right breast asymmetry. Upon review of the screening
mammogram, additional areas in both breasts or evaluated.



[L CC synth-2D]
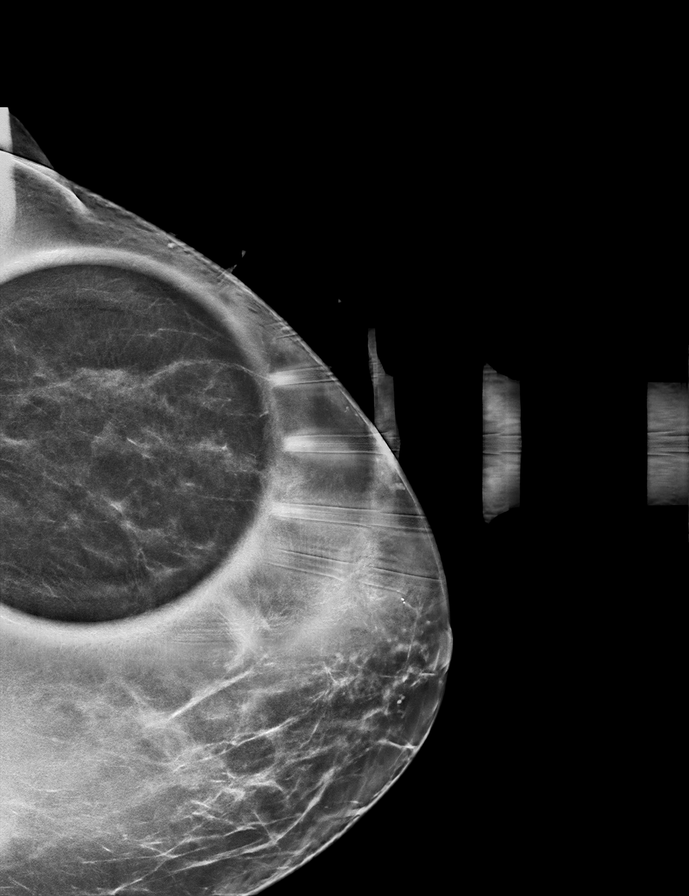

[R CC synth-2D (1 of 2)]
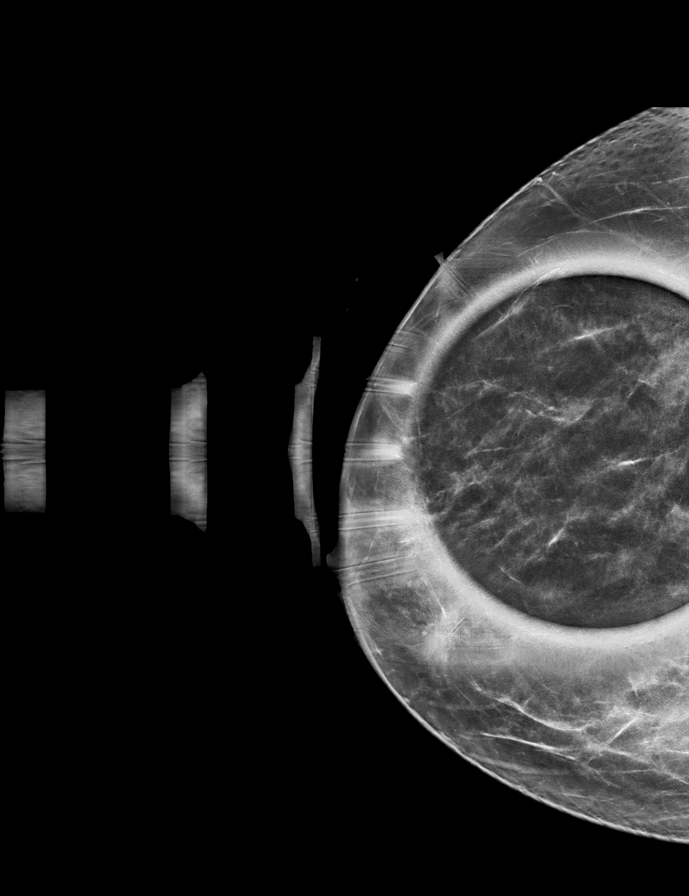

[L MLO synth-2D]
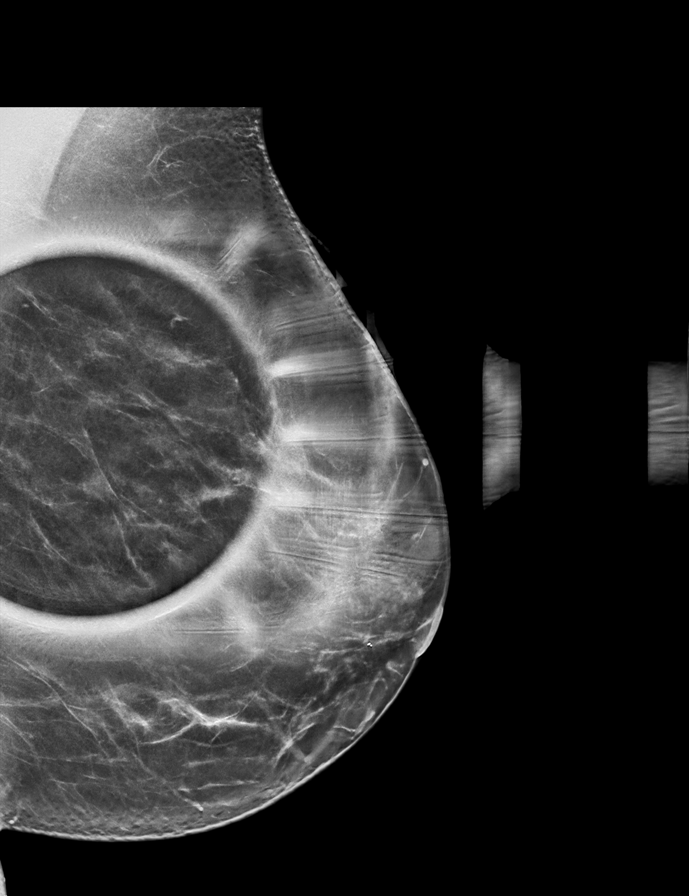

[R CC synth-2D (2 of 2)]
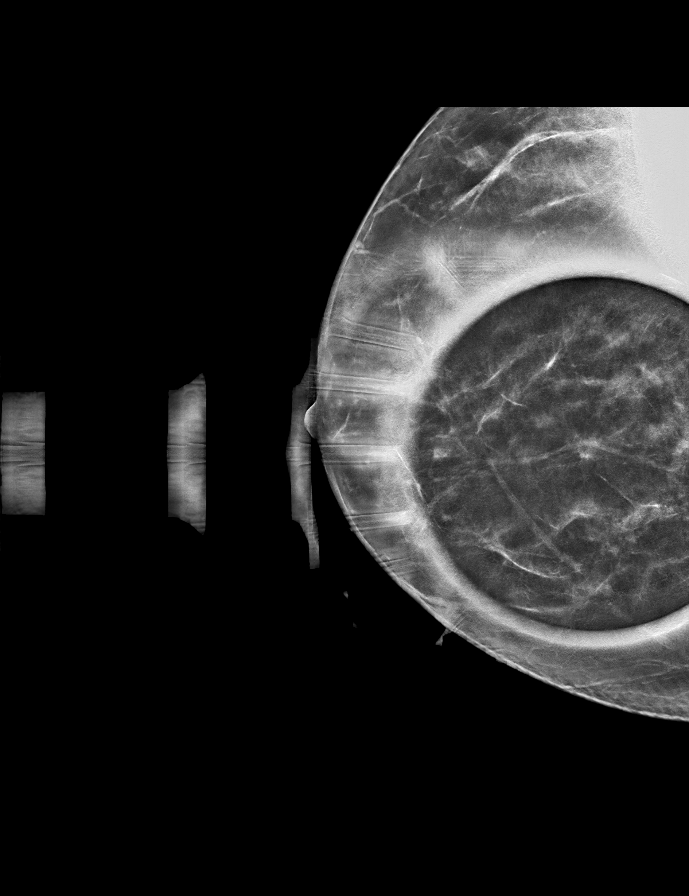

[R ML synth-2D]
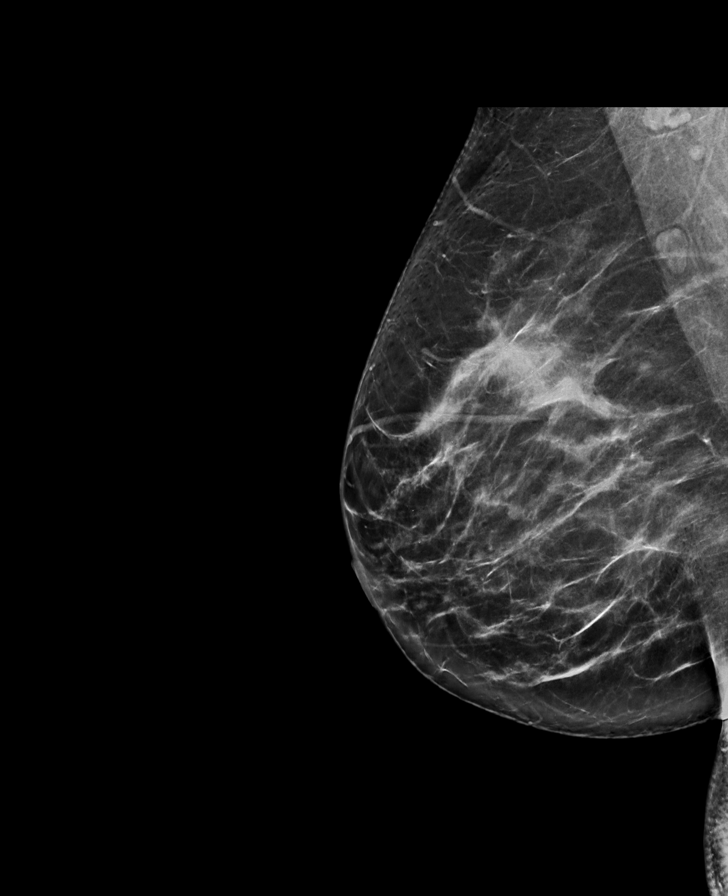

[R MLO synth-2D (1 of 2)]
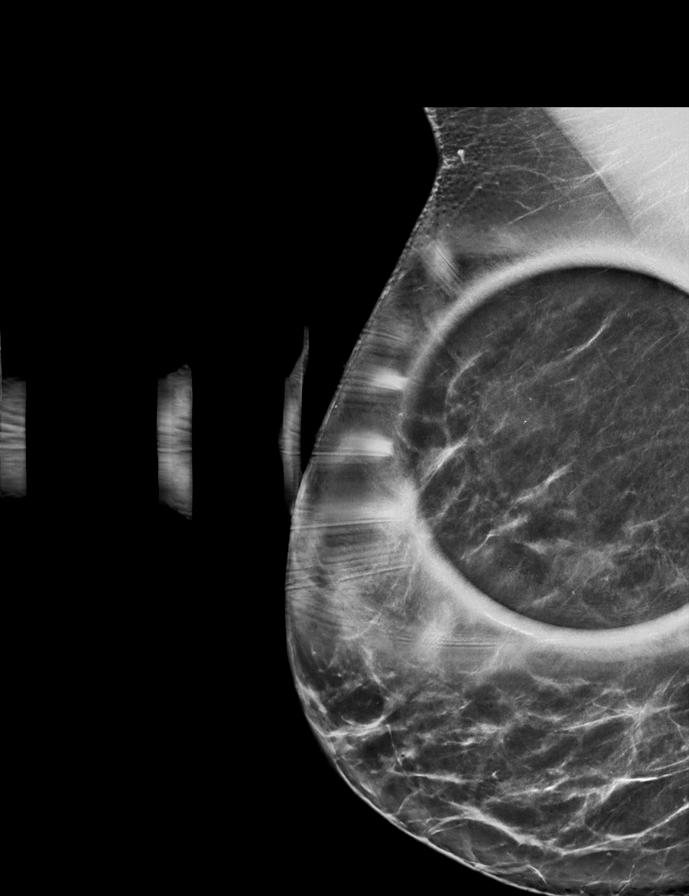

[R MLO synth-2D (2 of 2)]
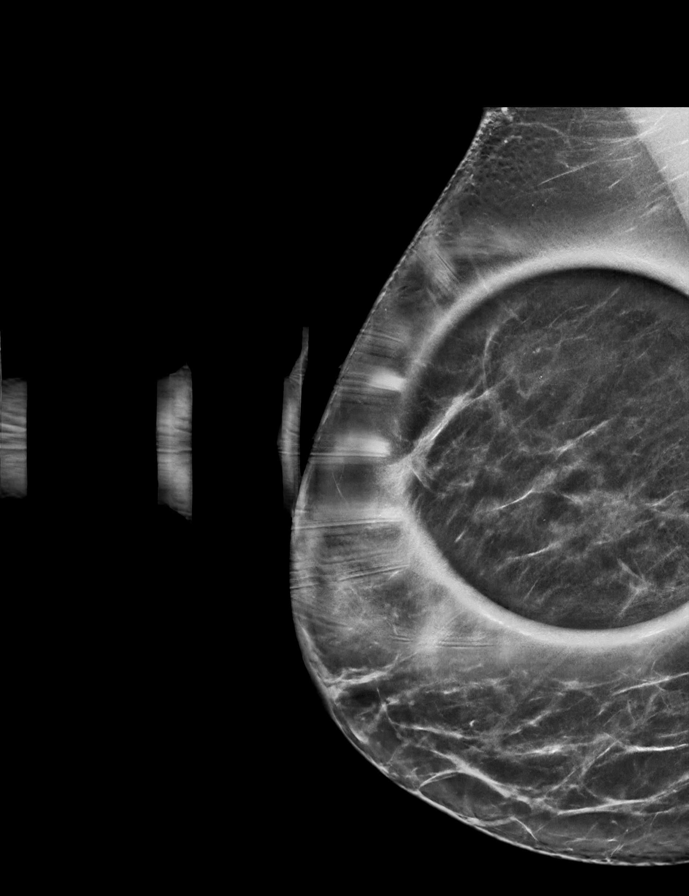

[R CC tomo · tomo slice 33/64.0]
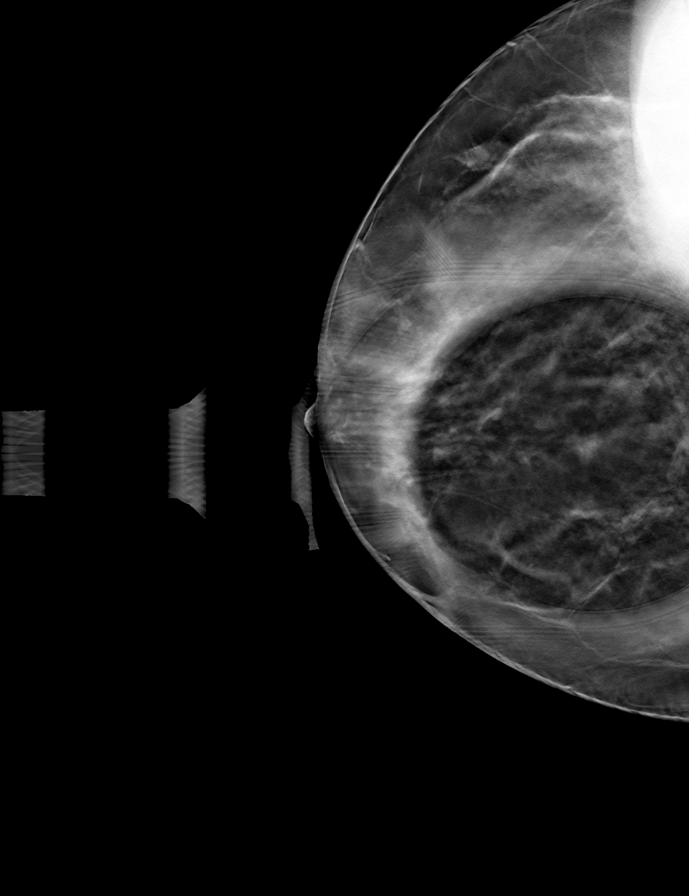

[8 of 40 positions shown; findings below may reference images not displayed]

ACR Breast Density Category b: There are scattered areas of
fibroglandular density.
FINDINGS: Spot compression tomosynthesis images through the asymmetry in the
upper-outer quadrant of the right breast demonstrates normal
fibroglandular tissue. No suspicious masses or areas of shadowing
are identified.

In the lateral middle depth of the right breast, there is an
obscured oval mass measuring approximately 9 mm.

In the lateral middle depth of the left breast, there is an obscured
oval mass measuring approximately 8 mm.

Ultrasound of the right breast at 9 o'clock, 4 cm from the nipple
demonstrates an anechoic oval circumscribed mass measuring 7 x 3 x 5
mm consistent with a benign cyst.

Ultrasound of the right breast at [DATE], 6 cm from the nipple
demonstrates a hypoechoic oval mass with an internal calcification,
favored to represent a fibroadenoma.

Ultrasound of the right axilla demonstrates multiple
normal-appearing lymph nodes.

Ultrasound of the left breast at 3 o'clock, 6 cm from the nipple
demonstrates a septated anechoic oval mass measuring 8 x 4 x 6 mm.
IMPRESSION: 1. There is a likely benign right breast mass at [DATE], favored to
represent a fibroadenoma.

2. No persistent suspicious abnormalities are identified in the
region of the asymmetry in the upper-outer right breast. The
appearance on the diagnostic images is consistent with normal
fibroglandular tissue.

3.  Bilateral benign fibrocystic changes.

RECOMMENDATION:
Six-month follow-up right breast ultrasound.

I have discussed the findings and recommendations with the patient.
If applicable, a reminder letter will be sent to the patient
regarding the next appointment.

BI-RADS CATEGORY  3: Probably benign.

## 2022-10-03 ENCOUNTER — Other Ambulatory Visit: Payer: BC Managed Care – PPO

## 2022-10-07 ENCOUNTER — Ambulatory Visit: Payer: BC Managed Care – PPO

## 2022-10-07 ENCOUNTER — Ambulatory Visit
Admission: RE | Admit: 2022-10-07 | Discharge: 2022-10-07 | Disposition: A | Discharge status: Home / Self Care | Payer: BC Managed Care – PPO | Source: Ambulatory Visit | Attending: Obstetrics and Gynecology | Admitting: Obstetrics and Gynecology

## 2022-10-07 DIAGNOSIS — N6310 Unspecified lump in the right breast, unspecified quadrant: Secondary | ICD-10-CM

## 2022-10-25 ENCOUNTER — Ambulatory Visit
Admission: RE | Admit: 2022-10-25 | Discharge: 2022-10-25 | Disposition: A | Discharge status: Home / Self Care | Payer: BC Managed Care – PPO | Source: Ambulatory Visit | Attending: Obstetrics and Gynecology | Admitting: Obstetrics and Gynecology

## 2022-10-25 DIAGNOSIS — N6310 Unspecified lump in the right breast, unspecified quadrant: Secondary | ICD-10-CM

## 2022-11-05 ENCOUNTER — Other Ambulatory Visit: Payer: BC Managed Care – PPO

## 2022-11-11 ENCOUNTER — Other Ambulatory Visit: Payer: BC Managed Care – PPO

## 2023-03-31 IMAGING — US US BREAST*R* LIMITED INC AXILLA
1 series · 5 of 5 positions shown · non-contrast
Comparison: 09/27/2020.

CLINICAL DATA: Six-month interval follow-up of a likely benign mass
involving the UPPER OUTER QUADRANT of the RIGHT breast at the 9:30
o'clock position 6 cm from nipple.

EXAM:
ULTRASOUND OF THE RIGHT BREAST

[Series 1: us breast*right* limited inc axilla · 0.07mm/px · 5 of 5 slices shown]
[im 1/5]
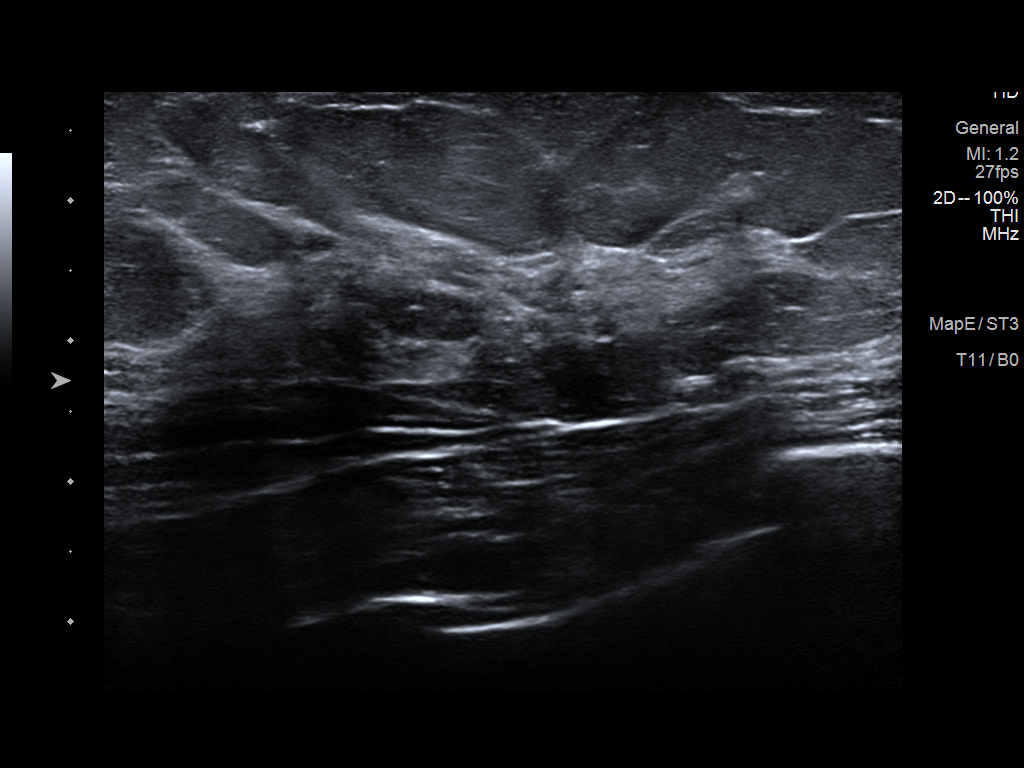
[im 2/5]
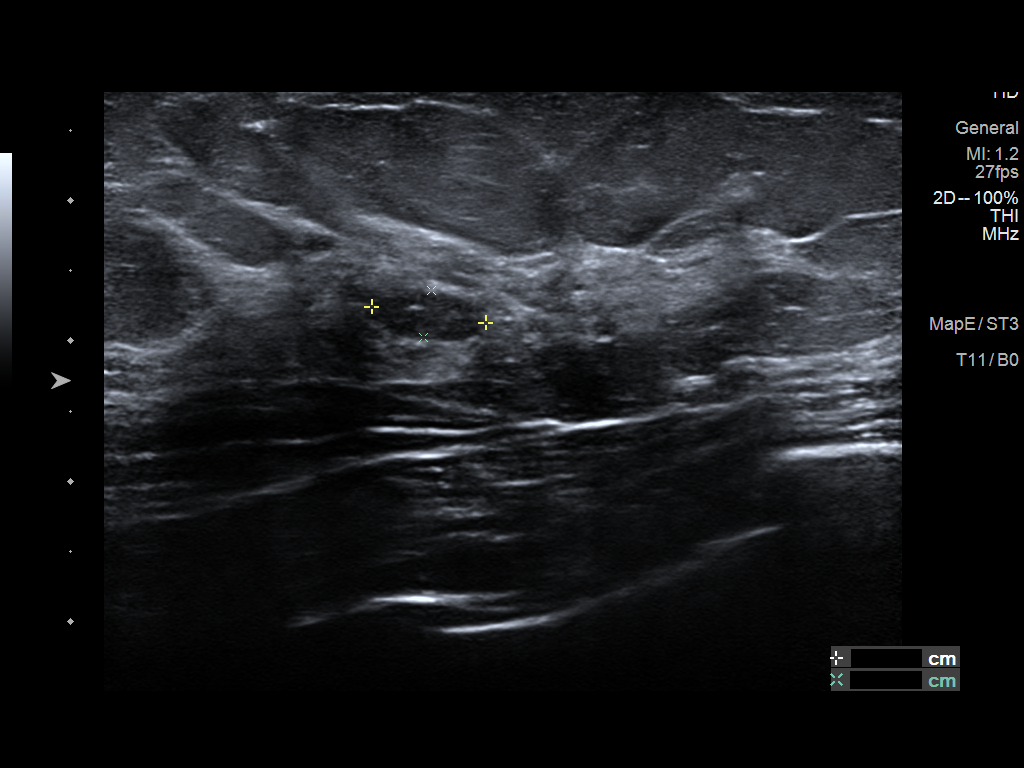
[im 3/5]
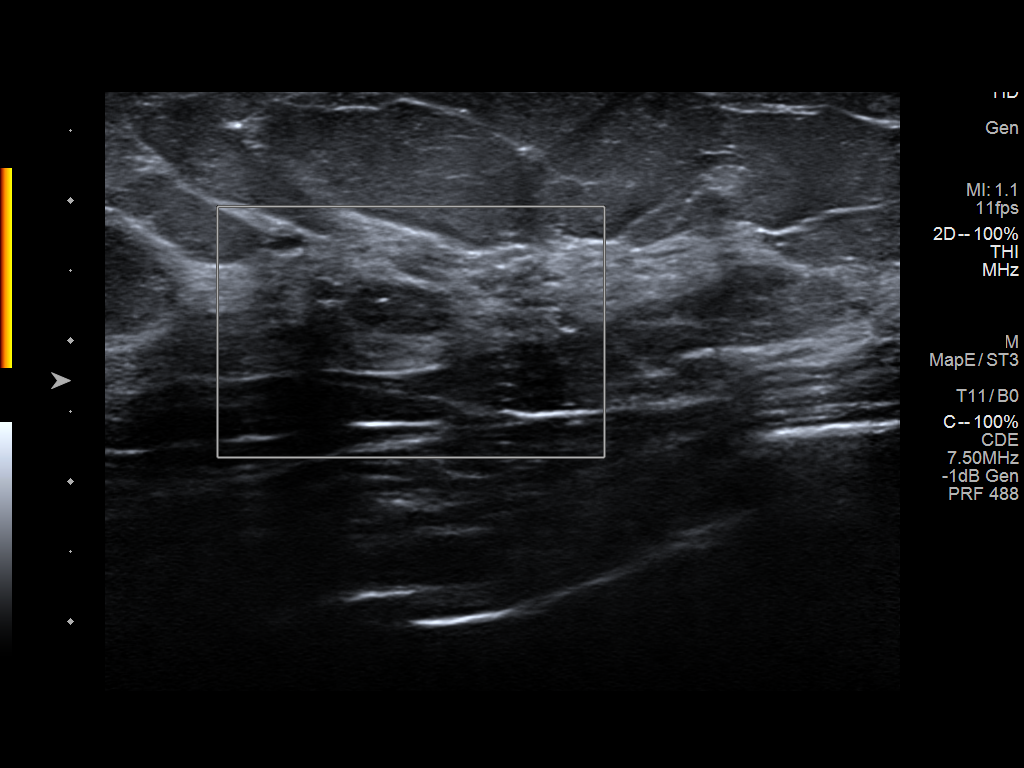
[im 4/5]
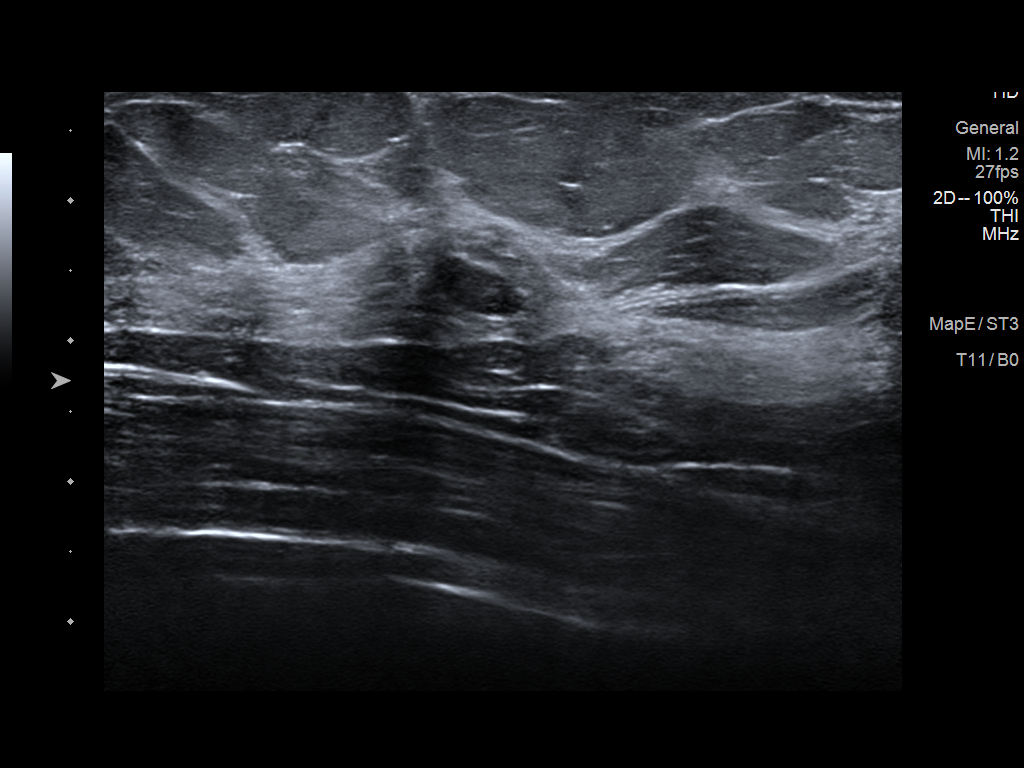
[im 5/5]
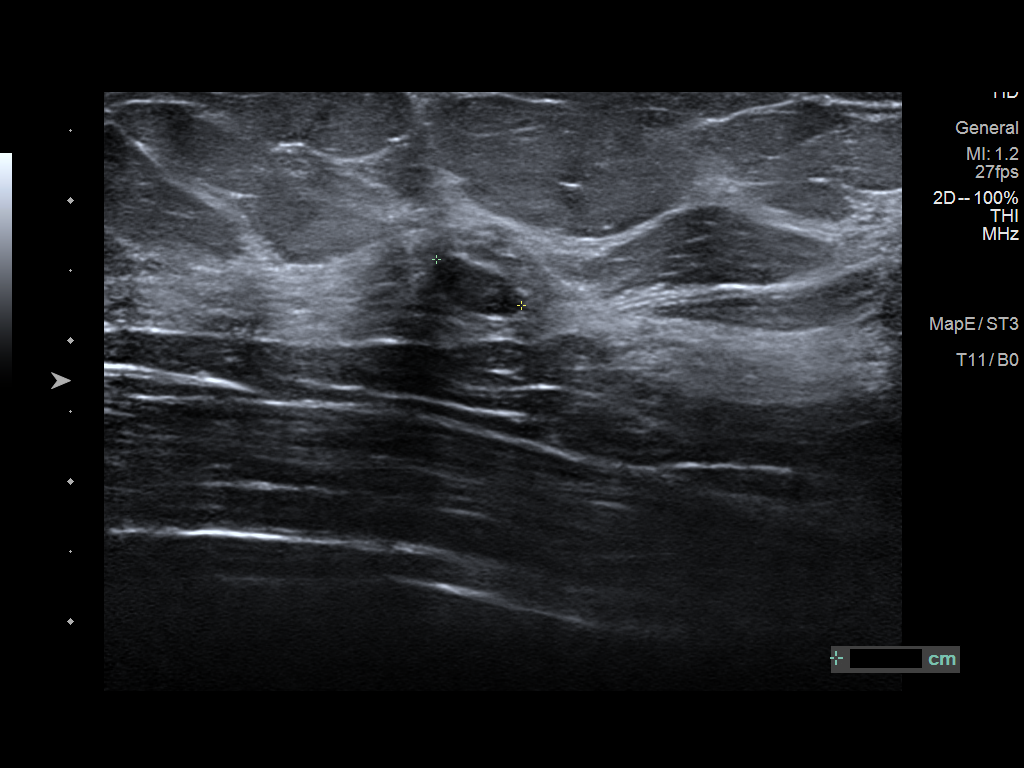

[5 of 5 positions shown; findings below may reference images not displayed]

FINDINGS: Targeted ultrasound is performed, showing the previously identified
circumscribed oval parallel hypoechoic mass at the 9:30 o'clock
position 6 cm nipple measuring approximately 8 x 3 x 7 mm
(previously 9 x 4 x 7 mm), demonstrating posterior acoustic
enhancement and no internal power Doppler flow.
IMPRESSION: Stable likely benign 8 mm mass involving the UPPER OUTER QUADRANT of
the RIGHT breast at 9:30 o'clock 6 cm from the nipple, possibly a
fibroadenoma.

RECOMMENDATION:
RIGHT breast ultrasound at the time of annual BILATERAL diagnostic
mammography in 6 months.

I have discussed the findings and recommendations with the patient.
If applicable, a reminder letter will be sent to the patient
regarding the next appointment.

BI-RADS CATEGORY  3: Probably benign.
